# Patient Record
Sex: Male | Born: 1975 | Race: Asian | Hispanic: No | Marital: Married
Health system: Midwestern US, Community
[De-identification: ages and names within clinical notes are randomized; demographics above are authoritative.]

## PROBLEM LIST (undated history)

## (undated) DIAGNOSIS — R2232 Localized swelling, mass and lump, left upper limb: Secondary | ICD-10-CM

## (undated) HISTORY — PX: OTHER SURGICAL HISTORY: SHX169

---

## 1998-08-31 ENCOUNTER — Emergency Department (HOSPITAL_COMMUNITY): Admission: EM | Admit: 1998-08-31 | Discharge: 1998-08-31 | Payer: Self-pay | Admitting: Emergency Medicine

## 2016-01-16 DIAGNOSIS — D2362 Other benign neoplasm of skin of left upper limb, including shoulder: Secondary | ICD-10-CM | POA: Diagnosis not present

## 2016-03-09 DIAGNOSIS — J028 Acute pharyngitis due to other specified organisms: Secondary | ICD-10-CM | POA: Diagnosis not present

## 2016-03-09 DIAGNOSIS — R042 Hemoptysis: Secondary | ICD-10-CM | POA: Diagnosis not present

## 2016-03-11 DIAGNOSIS — J019 Acute sinusitis, unspecified: Secondary | ICD-10-CM | POA: Diagnosis not present

## 2016-03-11 DIAGNOSIS — R52 Pain, unspecified: Secondary | ICD-10-CM | POA: Diagnosis not present

## 2016-03-11 DIAGNOSIS — J181 Lobar pneumonia, unspecified organism: Secondary | ICD-10-CM | POA: Diagnosis not present

## 2016-03-11 DIAGNOSIS — Z6828 Body mass index (BMI) 28.0-28.9, adult: Secondary | ICD-10-CM | POA: Diagnosis not present

## 2016-10-21 ENCOUNTER — Other Ambulatory Visit: Payer: Self-pay | Admitting: Orthopedic Surgery

## 2016-11-02 ENCOUNTER — Encounter (HOSPITAL_BASED_OUTPATIENT_CLINIC_OR_DEPARTMENT_OTHER): Payer: Self-pay | Admitting: *Deleted

## 2016-11-07 ENCOUNTER — Ambulatory Visit: Payer: Self-pay | Admitting: Plastic Surgery

## 2016-11-07 DIAGNOSIS — L989 Disorder of the skin and subcutaneous tissue, unspecified: Secondary | ICD-10-CM

## 2016-11-09 ENCOUNTER — Encounter (HOSPITAL_BASED_OUTPATIENT_CLINIC_OR_DEPARTMENT_OTHER): Payer: Self-pay | Admitting: *Deleted

## 2016-11-09 ENCOUNTER — Encounter (HOSPITAL_BASED_OUTPATIENT_CLINIC_OR_DEPARTMENT_OTHER)
Admission: RE | Disposition: A | Discharge status: Home / Self Care | Payer: Self-pay | Source: Ambulatory Visit | Attending: Orthopedic Surgery

## 2016-11-09 ENCOUNTER — Ambulatory Visit (HOSPITAL_BASED_OUTPATIENT_CLINIC_OR_DEPARTMENT_OTHER)
Admission: RE | Admit: 2016-11-09 | Discharge: 2016-11-09 | Disposition: A | Discharge status: Home / Self Care | Payer: Self-pay | Source: Ambulatory Visit | Attending: Orthopedic Surgery | Admitting: Orthopedic Surgery

## 2016-11-09 DIAGNOSIS — L989 Disorder of the skin and subcutaneous tissue, unspecified: Secondary | ICD-10-CM

## 2016-11-09 DIAGNOSIS — D2362 Other benign neoplasm of skin of left upper limb, including shoulder: Secondary | ICD-10-CM | POA: Insufficient documentation

## 2016-11-09 DIAGNOSIS — M7989 Other specified soft tissue disorders: Secondary | ICD-10-CM | POA: Diagnosis not present

## 2016-11-09 DIAGNOSIS — R2232 Localized swelling, mass and lump, left upper limb: Secondary | ICD-10-CM | POA: Diagnosis not present

## 2016-11-09 DIAGNOSIS — M7138 Other bursal cyst, other site: Secondary | ICD-10-CM | POA: Insufficient documentation

## 2016-11-09 HISTORY — PX: MASS EXCISION: SHX2000

## 2016-11-09 HISTORY — DX: Localized swelling, mass and lump, left upper limb: R22.32

## 2016-11-09 SURGERY — MINOR EXCISION OF MASS
Anesthesia: LOCAL | Site: Hand | Laterality: Left

## 2016-11-09 MED ORDER — 0.9 % SODIUM CHLORIDE (POUR BTL) OPTIME
TOPICAL | Status: DC | PRN
  Administered 2016-11-09: 120 mL

## 2016-11-09 MED ORDER — CEFAZOLIN SODIUM-DEXTROSE 2-4 GM/100ML-% IV SOLN: 2.0000 g | INTRAVENOUS | Status: DC

## 2016-11-09 MED ORDER — TRAMADOL HCL 50 MG PO TABS: 50.0000 mg | ORAL_TABLET | Freq: Four times a day (QID) | ORAL | 0 refills | Status: AC | PRN

## 2016-11-09 MED ORDER — LIDOCAINE-EPINEPHRINE 1 %-1:100000 IJ SOLN
INTRAMUSCULAR | Status: AC
  Filled 2016-11-09: qty 1

## 2016-11-09 MED ORDER — LIDOCAINE-EPINEPHRINE 1 %-1:100000 IJ SOLN
INTRAMUSCULAR | Status: DC | PRN
  Administered 2016-11-09: 2.5 mL

## 2016-11-09 MED ORDER — CHLORHEXIDINE GLUCONATE 4 % EX LIQD: 60.0000 mL | Freq: Once | CUTANEOUS | Status: DC

## 2016-11-09 MED ORDER — LIDOCAINE HCL (PF) 1 % IJ SOLN
INTRAMUSCULAR | Status: AC
  Filled 2016-11-09: qty 30

## 2016-11-09 MED ORDER — BUPIVACAINE HCL (PF) 0.5 % IJ SOLN
INTRAMUSCULAR | Status: AC
  Filled 2016-11-09: qty 30

## 2016-11-09 MED ORDER — LIDOCAINE HCL (PF) 1 % IJ SOLN
INTRAMUSCULAR | Status: AC
  Filled 2016-11-09: qty 5

## 2016-11-09 MED ORDER — BUPIVACAINE-EPINEPHRINE 0.25% -1:200000 IJ SOLN
INTRAMUSCULAR | Status: AC
  Filled 2016-11-09: qty 1

## 2016-11-09 MED ORDER — BACITRACIN ZINC 500 UNIT/GM EX OINT
TOPICAL_OINTMENT | CUTANEOUS | Status: AC
  Filled 2016-11-09: qty 0.9

## 2016-11-09 MED ORDER — BUPIVACAINE HCL (PF) 0.5 % IJ SOLN
INTRAMUSCULAR | Status: DC | PRN
  Administered 2016-11-09: 2.5 mL

## 2016-11-09 SURGICAL SUPPLY — 85 items
BAG DECANTER FOR FLEXI CONT (MISCELLANEOUS) IMPLANT
BENZOIN TINCTURE PRP APPL 2/3 (GAUZE/BANDAGES/DRESSINGS) IMPLANT
BLADE CLIPPER SURG (BLADE) IMPLANT
BLADE MINI RND TIP GREEN BEAV (BLADE) IMPLANT
BLADE SURG 15 STRL LF DISP TIS (BLADE) ×4 IMPLANT
BLADE SURG 15 STRL SS (BLADE) ×2
BNDG COHESIVE 1X5 TAN STRL LF (GAUZE/BANDAGES/DRESSINGS) IMPLANT
BNDG COHESIVE 3X5 TAN STRL LF (GAUZE/BANDAGES/DRESSINGS) IMPLANT
BNDG CONFORM 2 STRL LF (GAUZE/BANDAGES/DRESSINGS) IMPLANT
BNDG ELASTIC 2X5.8 VLCR STR LF (GAUZE/BANDAGES/DRESSINGS) IMPLANT
BNDG ESMARK 4X9 LF (GAUZE/BANDAGES/DRESSINGS) ×3 IMPLANT
BNDG GAUZE ELAST 4 BULKY (GAUZE/BANDAGES/DRESSINGS) IMPLANT
CANISTER SUCT 1200ML W/VALVE (MISCELLANEOUS) IMPLANT
CHLORAPREP W/TINT 26ML (MISCELLANEOUS) ×3 IMPLANT
CLEANER CAUTERY TIP 5X5 PAD (MISCELLANEOUS) IMPLANT
CORD BIPOLAR FORCEPS 12FT (ELECTRODE) ×3 IMPLANT
COVER BACK TABLE 60X90IN (DRAPES) ×3 IMPLANT
COVER MAYO STAND STRL (DRAPES) ×6 IMPLANT
CUFF TOURNIQUET SINGLE 18IN (TOURNIQUET CUFF) ×3 IMPLANT
DERMABOND ADVANCED (GAUZE/BANDAGES/DRESSINGS) ×2
DERMABOND ADVANCED .7 DNX12 (GAUZE/BANDAGES/DRESSINGS) ×4 IMPLANT
DRAIN PENROSE 1/2X12 LTX STRL (WOUND CARE) IMPLANT
DRAIN PENROSE 1/4X12 LTX STRL (WOUND CARE) IMPLANT
DRAPE SURG 17X23 STRL (DRAPES) ×3 IMPLANT
DRAPE U-SHAPE 76X120 STRL (DRAPES) IMPLANT
ELECT COATED BLADE 2.86 ST (ELECTRODE) IMPLANT
ELECT NEEDLE BLADE 2-5/6 (NEEDLE) ×3 IMPLANT
ELECT REM PT RETURN 9FT ADLT (ELECTROSURGICAL)
ELECT REM PT RETURN 9FT PED (ELECTROSURGICAL)
ELECTRODE REM PT RETRN 9FT PED (ELECTROSURGICAL) IMPLANT
ELECTRODE REM PT RTRN 9FT ADLT (ELECTROSURGICAL) IMPLANT
GAUZE PACKING IODOFORM 1/4X15 (GAUZE/BANDAGES/DRESSINGS) IMPLANT
GAUZE SPONGE 4X4 12PLY STRL (GAUZE/BANDAGES/DRESSINGS) ×3 IMPLANT
GAUZE SPONGE 4X4 12PLY STRL LF (GAUZE/BANDAGES/DRESSINGS) IMPLANT
GAUZE XEROFORM 1X8 LF (GAUZE/BANDAGES/DRESSINGS) ×3 IMPLANT
GLOVE BIO SURGEON STRL SZ 6.5 (GLOVE) ×9 IMPLANT
GLOVE BIOGEL PI IND STRL 7.0 (GLOVE) ×2 IMPLANT
GLOVE BIOGEL PI IND STRL 8.5 (GLOVE) ×2 IMPLANT
GLOVE BIOGEL PI INDICATOR 7.0 (GLOVE) ×1
GLOVE BIOGEL PI INDICATOR 8.5 (GLOVE) ×1
GLOVE EXAM NITRILE MD LF STRL (GLOVE) ×3 IMPLANT
GLOVE SURG ORTHO 8.0 STRL STRW (GLOVE) ×3 IMPLANT
GOWN STRL REUS W/ TWL LRG LVL3 (GOWN DISPOSABLE) ×4 IMPLANT
GOWN STRL REUS W/TWL LRG LVL3 (GOWN DISPOSABLE) ×2
LOOP VESSEL MAXI BLUE (MISCELLANEOUS) IMPLANT
MARKER SKIN DUAL TIP RULER LAB (MISCELLANEOUS) ×3 IMPLANT
NEEDLE HYPO 30GX1 BEV (NEEDLE) ×3 IMPLANT
NEEDLE PRECISIONGLIDE 27X1.5 (NEEDLE) ×3 IMPLANT
NS IRRIG 1000ML POUR BTL (IV SOLUTION) ×3 IMPLANT
PACK BASIN DAY SURGERY FS (CUSTOM PROCEDURE TRAY) ×6 IMPLANT
PAD CAST 3X4 CTTN HI CHSV (CAST SUPPLIES) IMPLANT
PAD CLEANER CAUTERY TIP 5X5 (MISCELLANEOUS)
PADDING CAST ABS 4INX4YD NS (CAST SUPPLIES) ×1
PADDING CAST ABS COTTON 4X4 ST (CAST SUPPLIES) ×2 IMPLANT
PADDING CAST COTTON 3X4 STRL (CAST SUPPLIES)
PENCIL BUTTON HOLSTER BLD 10FT (ELECTRODE) IMPLANT
RUBBERBAND STERILE (MISCELLANEOUS) IMPLANT
SHEET MEDIUM DRAPE 40X70 STRL (DRAPES) IMPLANT
SPLINT PLASTER CAST XFAST 3X15 (CAST SUPPLIES) IMPLANT
SPLINT PLASTER XTRA FASTSET 3X (CAST SUPPLIES)
SPONGE GAUZE 2X2 8PLY STRL LF (GAUZE/BANDAGES/DRESSINGS) IMPLANT
STRIP CLOSURE SKIN 1/2X4 (GAUZE/BANDAGES/DRESSINGS) IMPLANT
SUCTION FRAZIER HANDLE 10FR (MISCELLANEOUS)
SUCTION TUBE FRAZIER 10FR DISP (MISCELLANEOUS) IMPLANT
SUT CHROMIC 4 0 P 3 18 (SUTURE) IMPLANT
SUT ETHILON 4 0 PS 2 18 (SUTURE) IMPLANT
SUT ETHILON 5 0 P 3 18 (SUTURE)
SUT MNCRL 6-0 UNDY P1 1X18 (SUTURE) ×2 IMPLANT
SUT MNCRL AB 4-0 PS2 18 (SUTURE) IMPLANT
SUT MON AB 5-0 P3 18 (SUTURE) IMPLANT
SUT MONOCRYL 6-0 P1 1X18 (SUTURE) ×1
SUT NYLON ETHILON 5-0 P-3 1X18 (SUTURE) IMPLANT
SUT PLAIN 5 0 P 3 18 (SUTURE) IMPLANT
SUT VIC AB 5-0 P-3 18X BRD (SUTURE) IMPLANT
SUT VIC AB 5-0 P3 18 (SUTURE)
SUT VICRYL 4-0 PS2 18IN ABS (SUTURE) IMPLANT
SWAB COLLECTION DEVICE MRSA (MISCELLANEOUS) IMPLANT
SWAB CULTURE ESWAB REG 1ML (MISCELLANEOUS) IMPLANT
SYR BULB 3OZ (MISCELLANEOUS) ×3 IMPLANT
SYR CONTROL 10ML LL (SYRINGE) ×6 IMPLANT
TOWEL OR 17X24 6PK STRL BLUE (TOWEL DISPOSABLE) ×6 IMPLANT
TRAY DSU PREP LF (CUSTOM PROCEDURE TRAY) IMPLANT
TUBE CONNECTING 20X1/4 (TUBING) IMPLANT
TUBE FEEDING ENTERAL 5FR 16IN (TUBING) IMPLANT
UNDERPAD 30X30 (UNDERPADS AND DIAPERS) ×3 IMPLANT

## 2016-11-09 NOTE — H&P (Signed)
  Nicholas Pacheco is an 41 y.o. male.   Chief Complaint: mass left forearm HPI: Nicholas Pacheco is a 41 year old right-hand-dominant male referred by Dr. Virgina Jock for consultation regarding a mass on his left forearm this on the dorsal ulnar aspect mid distal third of his left forearm which has been present and slightly enlarging for the past 6 weeks. He recalls no history of injury. He does play soccer and was wondering if he may have the hair area hit. He states he has a soreness to it if it gets hit with a VAS score of 1-2/10. He taken anything for this. No history diabetes thyroid problems arthritis or gout.He was sent for ultrasound . This been done and reveals a solid mass on the distal ulnar forearm.          Past Medical History:  Diagnosis Date  . Forearm mass, left     No past surgical history on file.  No family history on file. Social History:  has no tobacco, alcohol, and drug history on file.  Allergies: No Known Allergies  No prescriptions prior to admission.    No results found for this or any previous visit (from the past 48 hour(s)).  No results found.   Pertinent items are noted in HPI.  Height 5\' 7"  (1.702 m), weight 77.1 kg (170 lb).  General appearance: alert, cooperative and appears stated age Head: Normocephalic, without obvious abnormality Neck: no JVD Resp: clear to auscultation bilaterally Cardio: regular rate and rhythm, S1, S2 normal, no murmur, click, rub or gallop GI: soft, non-tender; bowel sounds normal; no masses,  no organomegaly Extremities: mass left forearm Pulses: 2+ and symmetric Skin: Skin color, texture, turgor normal. No rashes or lesions Neurologic: Grossly normal Incision/Wound: na  Assessment/Plan Assessment:  1. Mass of left forearm    Plan: We have discussed excision of this with him. Prepare postoperative course are discussed along with risks and complications. He is where there is no guarantee to the surgery possibility of  infection recurrence injury to arteries nerves tendons he is advised that the only way we can tell what this is is to send it off to pathology. Is in agreement with this will attempt to schedule it with the other surgeon taking the skin lesion off. Can be done as an outpatient under local.      Jiya Kissinger R 11/09/2016, 4:14 AM

## 2016-11-09 NOTE — Op Note (Signed)
Dictation Number [id number]

## 2016-11-09 NOTE — Op Note (Signed)
DATE OF OPERATION: 11/09/2016  LOCATION: [facility]  PREOPERATIVE DIAGNOSIS: changing lesion left hand  POSTOPERATIVE DIAGNOSIS: Same  PROCEDURE: Excision of left hand changing skin lesion 3 mm  SURGEON: Claire Sanger Dillingham, DO  EBL: 2 cc  CONDITION: Stable  COMPLICATIONS: None  INDICATION: The patient, Nicholas Pacheco, is a 41 y.o. male born on 13-Feb-1976, is here for treatment of a changing lesion of the left hand that has been getting larger over time.   PROCEDURE DETAILS:  The patient was seen prior to surgery and marked.  The IV antibiotics were given. The patient was taken to the operating room and given a anesthetic. A standard time out was performed and all information was confirmed by those in the room. SCDs were placed.  Local was injected at the site. The left upper extremity was prepped and draped in the usual sterile fashion.  The #15 blade was used to excise the 3 mm lesion in an elliptical fashion.  The lesion was sent to pathology.  The deep layers were closed with 6-0 Monocryl.  The skin was closed with a running subcuticular 6-0 Monocryl.  Derma bond was applied.  The patient was allowed to wake up and taken to recovery room in stable condition at the end of the case.

## 2016-11-09 NOTE — Op Note (Signed)
Nicholas Pacheco, Nicholas Pacheco  MEDICAL RECORD NO.:  [id number]  LOCATION:                                 FACILITY:  PHYSICIAN:  Daryll Brod, M.D.            DATE OF BIRTH:  DATE OF PROCEDURE:  11/09/2016 DATE OF DISCHARGE:                              OPERATIVE REPORT   PLACE OF SURGERY:  [facility].  PREOPERATIVE DIAGNOSIS:  Mass, left forearm.  POSTOPERATIVE DIAGNOSIS:  Mass, left forearm.  OPERATION:  Excisional biopsy of mass, left forearm.  SURGEON:  Daryll Brod, M.D.  ASSISTANT:  Audelia Hives, M.D.  ANESTHESIA:  Local.  HISTORY:  The patient is a 41 year old male with a history of a mass on his left distal forearm, this is on the dorsal ulnar aspect.  Ultrasound reveals this is solid.  He is desirous of having this removed.  Pre, peri, and postoperative course have been discussed along with risks and complications.  He is aware that there is no guarantee to the surgery; the possibility of infection; recurrence of injury to arteries, nerves, tendons; incomplete relief of symptoms; dystrophy.  In the preoperative area, the patient was seen, the extremity marked by both the patient and surgeon.  Antibiotic given.  DESCRIPTION OF PROCEDURE:  The patient was brought to the operating room.  Done in conjunction with Dr. Marla Roe for excision of a lesion on the skin of his left hand.  He was given a local infiltration anesthesia with 0.5% bupivacaine with epinephrine and 1% xylocaine without epinephrine, approximately 4 mL was used.  No tourniquet was used.  This was applied, but not inflated.  Injection was given after a time-out was taken, confirming the patient and procedure.  The limb was prepped with ChloraPrep prior to placement of drapes.  This was done in supine position.  Dr. Marla Roe performed her surgery first.  I assisted in retraction and cutting sutures.  First specimen was sent to Pathology.  A longitudinal  incision was then made over the mass on the forearm, carried down through subcutaneous tissue.  The mass was immediately identified with blunt and sharp dissection, this was dissected free, measured approximately 1 x 0.5 cm in diameter.  It was a solid in nature, white in appearance.  The specimen was sent to Pathology.  Wound was copiously irrigated with saline.  The subcutaneous tissue was closed with an interrupted 6-0 Monocryl suture and the skin with a subcuticular 6-0 Monocryl suture.  Dermabond was applied.  A sterile compressive dressing was applied to the forearm and hand.  The patient tolerated the procedure well and was taken to the recovery room for observation in satisfactory condition.  He will be discharged to home to return to [place] in 1 week on Ultram.          ______________________________ Daryll Brod, M.D.     GK/MEDQ  D:  11/09/2016  T:  11/09/2016  Job:  [id number]

## 2016-11-09 NOTE — Brief Op Note (Signed)
11/09/2016  9:31 AM  PATIENT:  Nicholas Pacheco  41 y.o. male  PRE-OPERATIVE DIAGNOSIS:  Changing Skin Lesion of Left Hand; Mass Left Forearm   R22.9   POST-OPERATIVE DIAGNOSIS:  Changing Skin Lesion of Left Hand; Mass Left Forearm     PROCEDURE:  Procedure(s): MINOR EXCISION OF CHANGING SKIN LESION OF LEFT HAND (Left) EXCISION MASS LEFT FOREARM (Left)  SURGEON:  Surgeon(s) and Role: Panel 1:    * Dillingham, Loel Lofty, DO - Primary  Panel 2:    * Daryll Brod, MD - Primary  PHYSICIAN ASSISTANT:   ASSISTANTS: C Dillingham, MD   ANESTHESIA:   local  EBL:  No intake/output data recorded.  BLOOD ADMINISTERED:none  DRAINS: none   LOCAL MEDICATIONS USED:  BUPIVICAINE  and XYLOCAINE   SPECIMEN:  Excision  DISPOSITION OF SPECIMEN:  PATHOLOGY  COUNTS:  YES  TOURNIQUET:    DICTATION: .Other Dictation: Dictation Number [id number]  PLAN OF CARE: Discharge to home after PACU  PATIENT DISPOSITION:  PACU - hemodynamically stable.

## 2016-11-09 NOTE — H&P (Addendum)
Nicholas Pacheco is an 41 y.o. male.   Chief Complaint: changing lesion left hand HPI: The patient is a 41 yrs old male here for treatment of a left hand changing skin lesion.  The area is on the dorsal aspect of the left hand.  It is firm and slightly movable.  It is not tender but seems to be getting larger.  He is otherwise in good health.  He has an additional lesion on the arm that will be excised by ortho.  Past Medical History:  Diagnosis Date  . Forearm mass, left     Past Surgical History:  Procedure Laterality Date  . OTHER SURGICAL HISTORY     small mass on middle of back done under local    History reviewed. No pertinent family history. Social History:  reports that he has never smoked. He has never used smokeless tobacco. He reports that he drinks about 8.4 oz of alcohol per week . His drug history is not on file.  Allergies: No Known Allergies  No prescriptions prior to admission.    No results found for this or any previous visit (from the past 48 hour(s)). No results found.  Review of Systems  Constitutional: Negative.   HENT: Negative.   Eyes: Negative.   Respiratory: Negative.   Cardiovascular: Negative.   Gastrointestinal: Negative.   Genitourinary: Negative.   Musculoskeletal: Negative.   Skin: Negative.   Neurological: Negative.   Psychiatric/Behavioral: Negative.     Blood pressure 119/87, pulse (!) 55, temperature 98.4 F (36.9 C), temperature source Oral, resp. rate 18, height 5\' 7"  (1.702 m), weight 77.1 kg (170 lb), SpO2 97 %. Physical Exam  Constitutional: He is oriented to person, place, and time. He appears well-developed and well-nourished.  HENT:  Head: Normocephalic and atraumatic.  Eyes: Pupils are equal, round, and reactive to light. Conjunctivae and EOM are normal.  Cardiovascular: Normal rate.   Respiratory: Effort normal. No respiratory distress.  GI: Soft. He exhibits no distension. There is no tenderness.  Musculoskeletal:   Arms: Neurological: He is alert and oriented to person, place, and time.  Skin: Skin is warm. No rash noted. No erythema. No pallor.  Psychiatric: He has a normal mood and affect. His behavior is normal. Judgment and thought content normal.     Assessment/Plan Plan excision of left hand lesion with primary closure and path evaluation.  Nicholas Going, DO 11/09/2016, 8:51 AM

## 2016-11-09 NOTE — Discharge Instructions (Signed)

## 2016-11-10 ENCOUNTER — Encounter (HOSPITAL_BASED_OUTPATIENT_CLINIC_OR_DEPARTMENT_OTHER): Payer: Self-pay | Admitting: Plastic Surgery

## 2016-12-13 DIAGNOSIS — Z0000 Encounter for general adult medical examination without abnormal findings: Secondary | ICD-10-CM | POA: Diagnosis not present

## 2016-12-16 DIAGNOSIS — Z0000 Encounter for general adult medical examination without abnormal findings: Secondary | ICD-10-CM | POA: Diagnosis not present

## 2016-12-16 DIAGNOSIS — Z23 Encounter for immunization: Secondary | ICD-10-CM | POA: Diagnosis not present

## 2016-12-16 DIAGNOSIS — Z1389 Encounter for screening for other disorder: Secondary | ICD-10-CM | POA: Diagnosis not present

## 2016-12-22 DIAGNOSIS — Z1212 Encounter for screening for malignant neoplasm of rectum: Secondary | ICD-10-CM | POA: Diagnosis not present

## 2017-02-04 DIAGNOSIS — Z302 Encounter for sterilization: Secondary | ICD-10-CM | POA: Diagnosis not present

## 2017-05-04 DIAGNOSIS — N451 Epididymitis: Secondary | ICD-10-CM | POA: Diagnosis not present

## 2017-05-06 ENCOUNTER — Other Ambulatory Visit: Payer: Self-pay | Admitting: Urology

## 2017-05-06 DIAGNOSIS — N451 Epididymitis: Secondary | ICD-10-CM

## 2017-05-09 ENCOUNTER — Ambulatory Visit (HOSPITAL_COMMUNITY)
Admission: RE | Admit: 2017-05-09 | Discharge: 2017-05-09 | Disposition: A | Discharge status: Home / Self Care | Payer: BLUE CROSS/BLUE SHIELD | Source: Ambulatory Visit | Attending: Urology | Admitting: Urology

## 2017-05-09 DIAGNOSIS — N451 Epididymitis: Secondary | ICD-10-CM | POA: Insufficient documentation

## 2017-05-09 DIAGNOSIS — N433 Hydrocele, unspecified: Secondary | ICD-10-CM | POA: Diagnosis not present

## 2017-05-09 DIAGNOSIS — N5089 Other specified disorders of the male genital organs: Secondary | ICD-10-CM | POA: Insufficient documentation

## 2017-06-10 DIAGNOSIS — N451 Epididymitis: Secondary | ICD-10-CM | POA: Diagnosis not present

## 2017-08-02 DIAGNOSIS — M7711 Lateral epicondylitis, right elbow: Secondary | ICD-10-CM | POA: Diagnosis not present

## 2017-08-29 DIAGNOSIS — M1711 Unilateral primary osteoarthritis, right knee: Secondary | ICD-10-CM | POA: Diagnosis not present

## 2017-08-29 DIAGNOSIS — M7711 Lateral epicondylitis, right elbow: Secondary | ICD-10-CM | POA: Diagnosis not present

## 2017-09-27 DIAGNOSIS — M7711 Lateral epicondylitis, right elbow: Secondary | ICD-10-CM | POA: Diagnosis not present

## 2017-11-28 DIAGNOSIS — S39012A Strain of muscle, fascia and tendon of lower back, initial encounter: Secondary | ICD-10-CM | POA: Diagnosis not present

## 2017-11-28 DIAGNOSIS — M545 Low back pain: Secondary | ICD-10-CM | POA: Diagnosis not present

## 2017-11-29 DIAGNOSIS — M7711 Lateral epicondylitis, right elbow: Secondary | ICD-10-CM | POA: Diagnosis not present

## 2017-12-16 DIAGNOSIS — Z0000 Encounter for general adult medical examination without abnormal findings: Secondary | ICD-10-CM | POA: Diagnosis not present

## 2017-12-16 DIAGNOSIS — Z125 Encounter for screening for malignant neoplasm of prostate: Secondary | ICD-10-CM | POA: Diagnosis not present

## 2017-12-30 DIAGNOSIS — M545 Low back pain: Secondary | ICD-10-CM | POA: Diagnosis not present

## 2017-12-30 DIAGNOSIS — M7711 Lateral epicondylitis, right elbow: Secondary | ICD-10-CM | POA: Diagnosis not present

## 2017-12-30 DIAGNOSIS — Z6826 Body mass index (BMI) 26.0-26.9, adult: Secondary | ICD-10-CM | POA: Diagnosis not present

## 2017-12-30 DIAGNOSIS — E7849 Other hyperlipidemia: Secondary | ICD-10-CM | POA: Diagnosis not present

## 2017-12-30 DIAGNOSIS — Z1389 Encounter for screening for other disorder: Secondary | ICD-10-CM | POA: Diagnosis not present

## 2017-12-30 DIAGNOSIS — Z0000 Encounter for general adult medical examination without abnormal findings: Secondary | ICD-10-CM | POA: Diagnosis not present

## 2018-01-11 DIAGNOSIS — M7711 Lateral epicondylitis, right elbow: Secondary | ICD-10-CM | POA: Diagnosis not present

## 2018-05-16 DIAGNOSIS — Z6823 Body mass index (BMI) 23.0-23.9, adult: Secondary | ICD-10-CM | POA: Diagnosis not present

## 2018-05-16 DIAGNOSIS — J111 Influenza due to unidentified influenza virus with other respiratory manifestations: Secondary | ICD-10-CM | POA: Diagnosis not present

## 2018-05-16 DIAGNOSIS — H6692 Otitis media, unspecified, left ear: Secondary | ICD-10-CM | POA: Diagnosis not present

## 2018-06-20 DIAGNOSIS — M7711 Lateral epicondylitis, right elbow: Secondary | ICD-10-CM | POA: Diagnosis not present

## 2018-07-04 DIAGNOSIS — H6692 Otitis media, unspecified, left ear: Secondary | ICD-10-CM | POA: Diagnosis not present

## 2018-07-04 DIAGNOSIS — R509 Fever, unspecified: Secondary | ICD-10-CM | POA: Diagnosis not present

## 2018-08-24 DIAGNOSIS — M7711 Lateral epicondylitis, right elbow: Secondary | ICD-10-CM | POA: Diagnosis not present

## 2018-09-17 IMAGING — US US SCROTUM W/ DOPPLER COMPLETE
1 series · 14 of 25 positions shown · non-contrast
Comparison: None.

CLINICAL DATA: Epididymitis.

EXAM:
SCROTAL ULTRASOUND
DOPPLER ULTRASOUND OF THE TESTICLES
TECHNIQUE: Complete ultrasound examination of the testicles, epididymis, and
other scrotal structures was performed. Color and spectral Doppler
ultrasound were also utilized to evaluate blood flow to the
testicles.

[Series 1: us scrotum w/ doppler complete · 0.06mm/px · 14 of 57 slices shown]
[im 1/57]
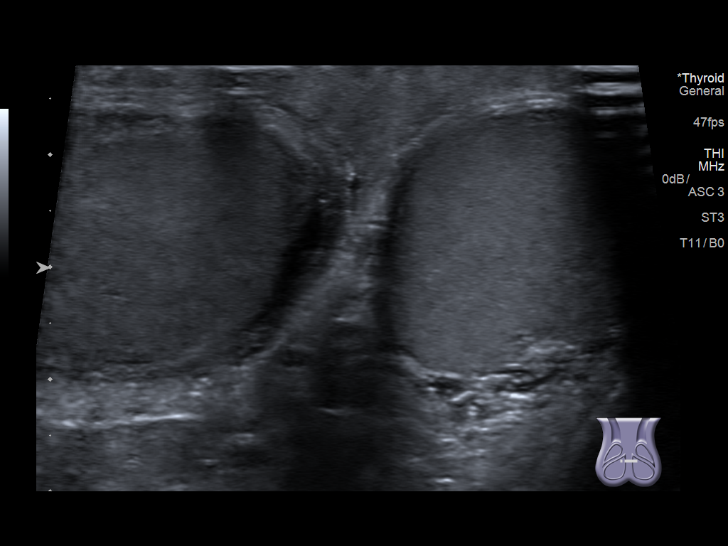
[im 5/57]
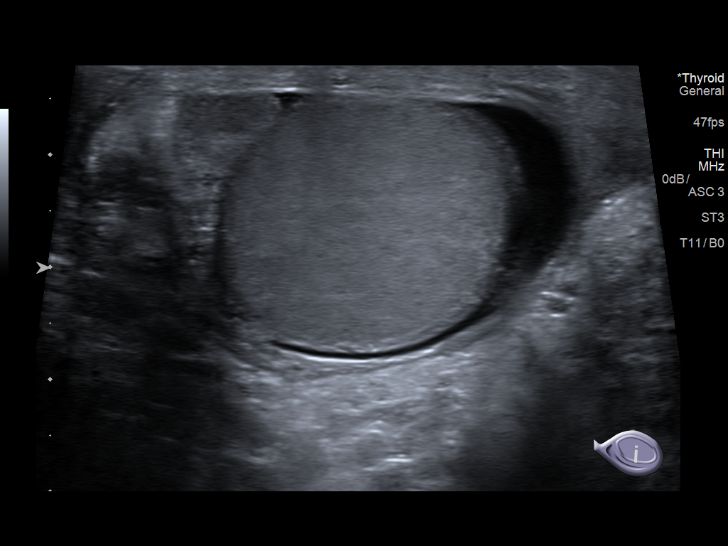
[im 10/57]
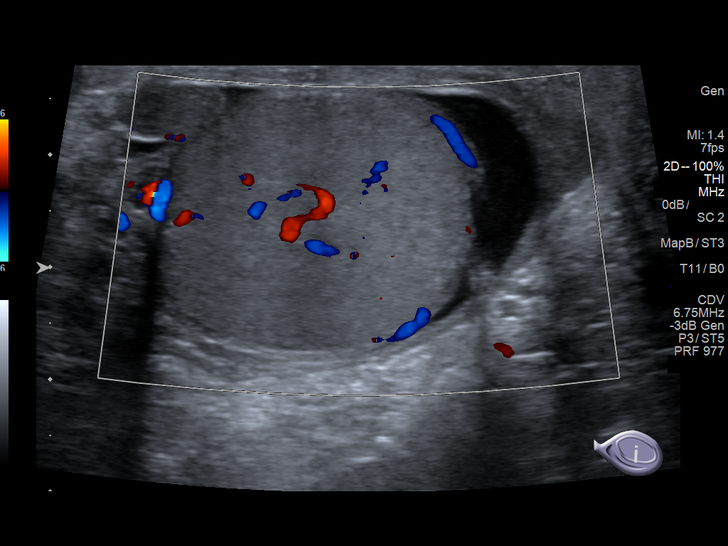
[im 15/57]
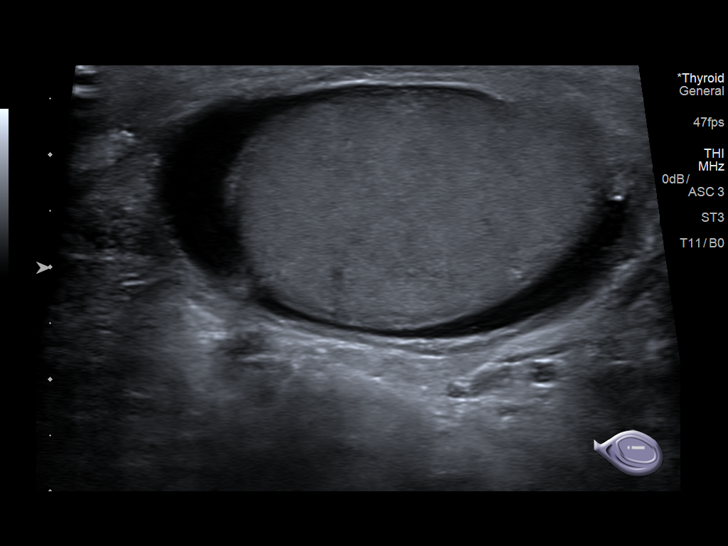
[im 19/57]
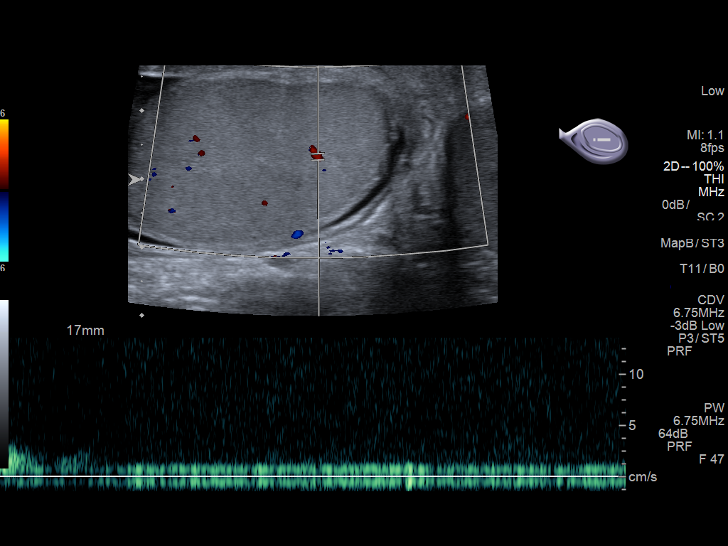
[im 22/57]
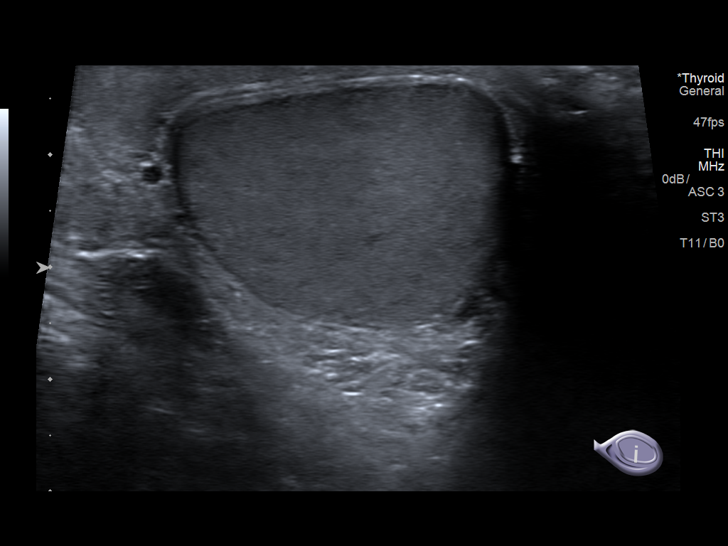
[im 26/57]
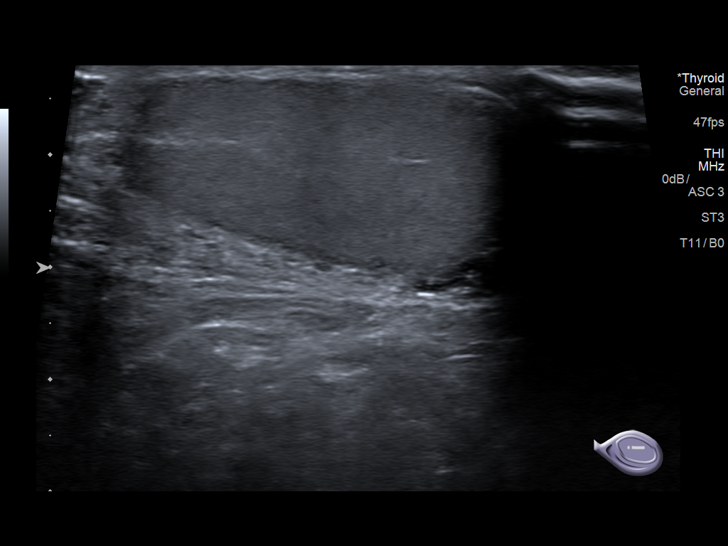
[im 31/57]
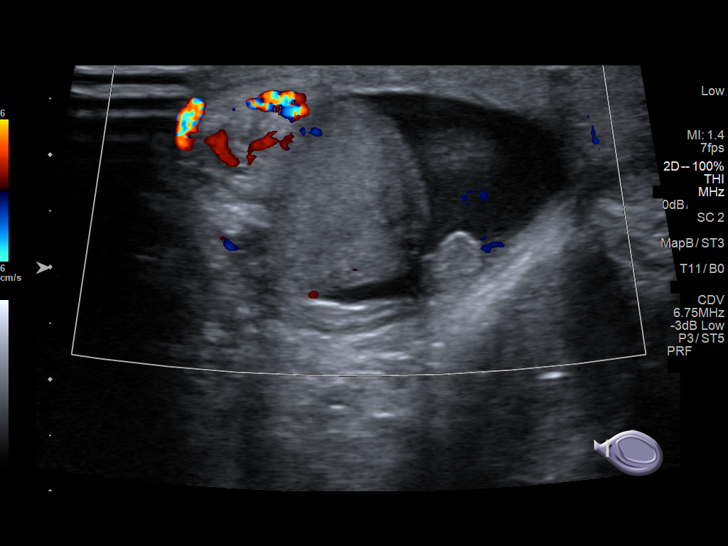
[im 36/57]
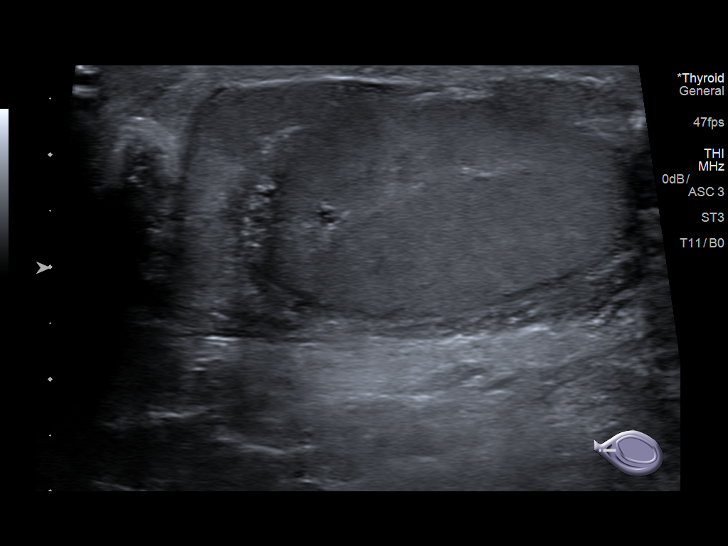
[im 38/57]
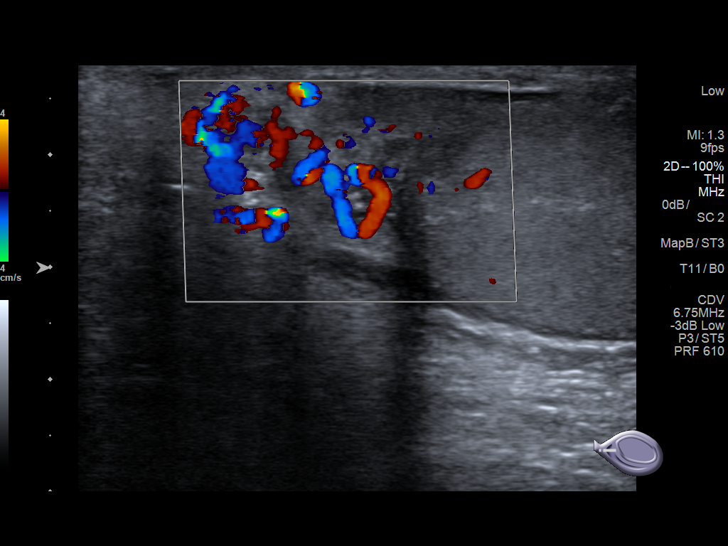
[im 43/57]
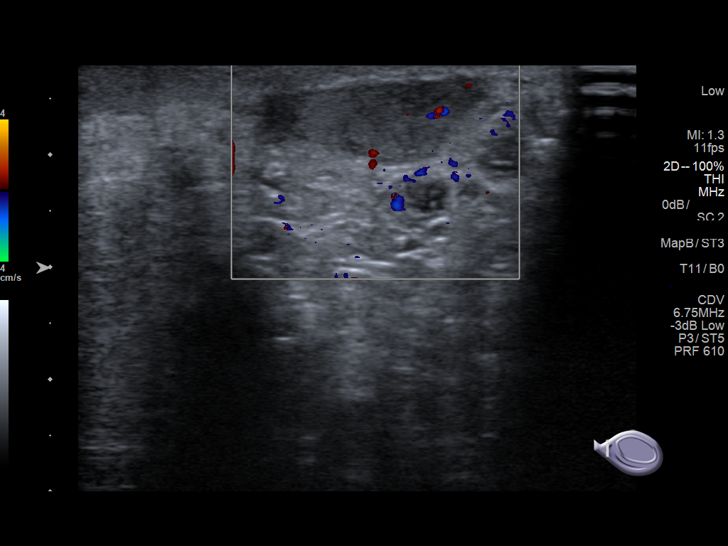
[im 47/57]
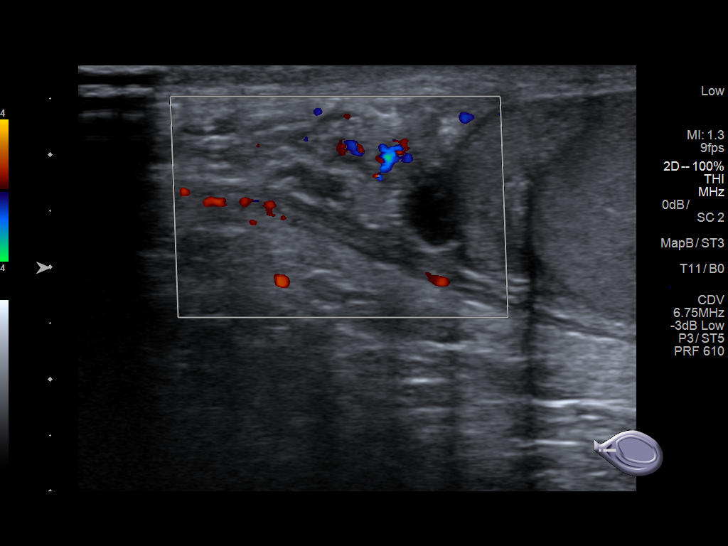
[im 52/57]
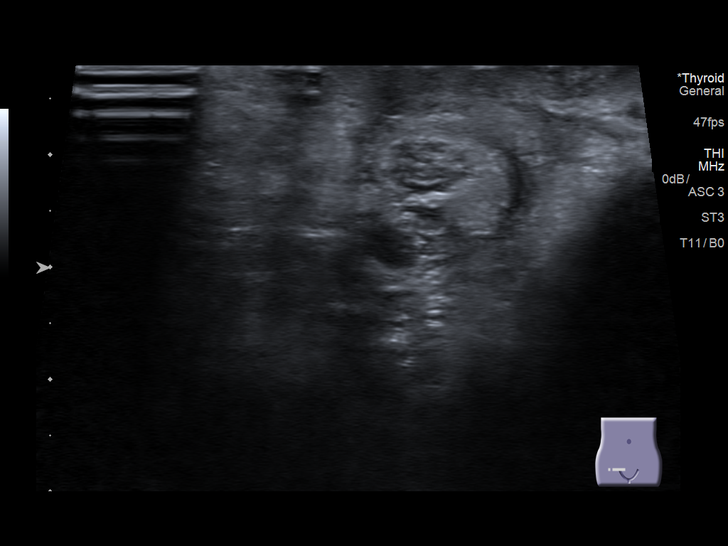
[im 57/57]
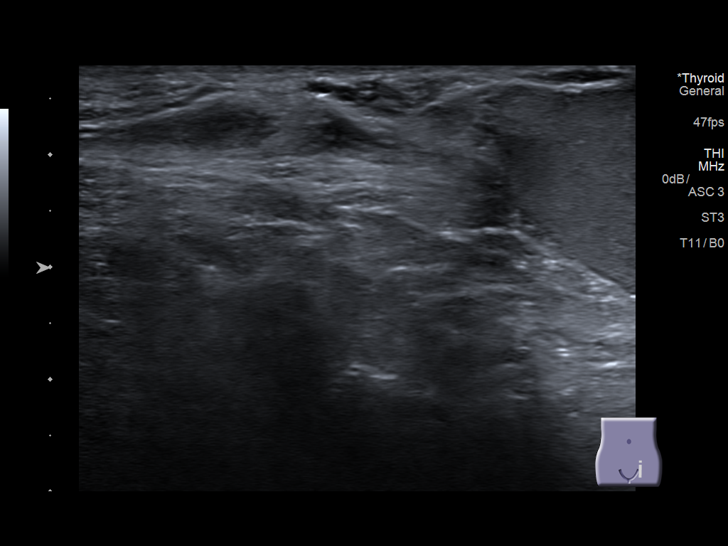

[14 of 25 positions shown; findings below may reference images not displayed]

FINDINGS: Right testicle

Measurements: 4.0 x 2.4 x 2.9 centimeters. No mass or microlithiasis
visualized.

Left testicle

Measurements: 4.0 x 2.2 x 2.9 centimeters. No mass or microlithiasis
visualized.

Right epididymis: Right epididymis is more prominent compared to
left. Doppler evaluation shows increased vascularity of the right
epididymis.

Left epididymis:  Normal in size and appearance.

Hydrocele:  Trace right hydrocele.

Varicocele:  None visualized.

Pulsed Doppler interrogation of both testes demonstrates normal low
resistance arterial and venous waveforms bilaterally.
IMPRESSION: 1. No evidence for testicular torsion or mass.
2. Prominent, enlarged right epididymis, consistent with
epididymitis.

## 2018-11-02 DIAGNOSIS — M7711 Lateral epicondylitis, right elbow: Secondary | ICD-10-CM | POA: Diagnosis not present

## 2019-01-01 DIAGNOSIS — E7849 Other hyperlipidemia: Secondary | ICD-10-CM | POA: Diagnosis not present

## 2019-01-01 DIAGNOSIS — Z125 Encounter for screening for malignant neoplasm of prostate: Secondary | ICD-10-CM | POA: Diagnosis not present

## 2019-01-01 DIAGNOSIS — R7989 Other specified abnormal findings of blood chemistry: Secondary | ICD-10-CM | POA: Diagnosis not present

## 2019-01-01 DIAGNOSIS — Z0000 Encounter for general adult medical examination without abnormal findings: Secondary | ICD-10-CM | POA: Diagnosis not present

## 2019-01-08 DIAGNOSIS — M7711 Lateral epicondylitis, right elbow: Secondary | ICD-10-CM | POA: Diagnosis not present

## 2019-01-08 DIAGNOSIS — E785 Hyperlipidemia, unspecified: Secondary | ICD-10-CM | POA: Diagnosis not present

## 2019-01-08 DIAGNOSIS — Z1331 Encounter for screening for depression: Secondary | ICD-10-CM | POA: Diagnosis not present

## 2019-01-08 DIAGNOSIS — R7989 Other specified abnormal findings of blood chemistry: Secondary | ICD-10-CM | POA: Diagnosis not present

## 2019-01-08 DIAGNOSIS — Z0000 Encounter for general adult medical examination without abnormal findings: Secondary | ICD-10-CM | POA: Diagnosis not present

## 2019-01-08 DIAGNOSIS — R209 Unspecified disturbances of skin sensation: Secondary | ICD-10-CM | POA: Diagnosis not present

## 2019-01-11 DIAGNOSIS — M7711 Lateral epicondylitis, right elbow: Secondary | ICD-10-CM | POA: Diagnosis not present

## 2020-11-19 NOTE — Telephone Encounter (Signed)
 Received call from Monona at [facility] with [employer] Complaint.    Subjective: Caller states I don't have the pain all the time. It definitely gets flared up when I climb. When I exert a lot of shoulder pressure to it, it flares up. I can't even function. If I raise it to a certain point it's an immediate sharp pain like it's going to snap. 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 Patient is currently unestablished. Writer advised patient to be seen at [facility] if unable to get new patient appointment within the next four hours. Patient agreeable.    Care advice provided, patient verbalizes understanding; denies any other questions or concerns; instructed to call back for any new or worsening symptoms.    Patient/Caller agrees with recommended disposition; Clinical research associate provided warm transfer to Allie at [facility] for appointment scheduling.    Attention Provider:  Thank you for allowing me to participate in the care of your patient.  The patient was connected to triage in response to information provided to the [facility].  Please do not respond through this encounter as the response is not directed to a shared pool.      Reason for Disposition   SEVERE pain (e.g., excruciating, unable to do any normal activities)    Protocols used: Shoulder Pain-ADULT-[place]

## 2020-11-20 ENCOUNTER — Ambulatory Visit: Attending: Family Medicine | Primary: Family Medicine

## 2020-11-20 ENCOUNTER — Ambulatory Visit
Admit: 2020-11-20 | Discharge: 2020-11-20 | Payer: BLUE CROSS/BLUE SHIELD | Attending: Family Medicine | Primary: Family Medicine

## 2020-11-20 ENCOUNTER — Encounter: Admit: 2020-11-20 | Payer: BLUE CROSS/BLUE SHIELD | Primary: Family Medicine

## 2020-11-20 DIAGNOSIS — M25512 Pain in left shoulder: Secondary | ICD-10-CM

## 2020-11-20 DIAGNOSIS — M7542 Impingement syndrome of left shoulder: Secondary | ICD-10-CM

## 2020-11-20 MED ORDER — MELOXICAM 15 MG TAB
15 mg | ORAL_TABLET | Freq: Every day | ORAL | 2 refills | Status: AC
Start: 2020-11-20 — End: ?

## 2020-11-20 NOTE — Progress Notes (Signed)
Chief Complaint   Patient presents with    Shoulder Pain     Patient is here for left shoulder pain      he is a 44 y.o. year old male who presents for evaluation of left shoulder  Pain Assessment Encounter      Taylor Duncan  11/20/2020  Onset of Symptoms: Patient states he has had pain for years   ________________________________________________________________________  Description:Patient states he thinks pain is from fly fishing. Patient also states he does rock climbing     Frequency: 4-5 times a day  Pain Scale:(1-10): 2  Trauma Hx: fly fishing  Hx of similar symptoms: No:   Radiation: NO, shoulder   Duration:  intermittent      Progression: has worsened  What makes it better?: heat, ice, and OTC meds  What makes it worse?:exercise and strecthing  Medications tried: acetaminophen, ibuprofen    Reviewed and agree with Nurse Note and duplicated in this note.  Reviewed PmHx, RxHx, FmHx, SocHx, AllgHx and updated and dated in the chart.    No family history on file.  No past medical history on file.   Social History     Socioeconomic History    Marital status: MARRIED        Review of Systems - negative except as listed above      Objective:     Vitals:    11/20/20 1028   BP: 117/78   Pulse: (!) 53   Resp: 16   Temp: 98 ??F (36.7 ??C)   SpO2: 97%   Weight: 158 lb (71.7 kg)       Physical Examination: General appearance - alert, well appearing, and in no distress  Back exam - full range of motion, no tenderness, palpable spasm or pain on motion  Neurological - alert, oriented, normal speech, no focal findings or movement disorder noted  Musculoskeletal - The left shoulder is  normal to inspection.    The patient has full range of motion  .    The shoulderis not tender to palpation .  Bicep tendon: non-tender  The NEER test is positive.  The Hawkins test:is positive   The Cross over test:is  negative.  The Empty Can test:is  negative.  Stressed ext rotation is:  negative.  Stressed int rotation: negative.   The  Apprehension Sign is negative.    The Lift off test is:  negative   Examination reveals the Painful Arch:  negative.   The Labral Test is:  not tested.   The Sulcus Sign is:  not tested.  Extremities - peripheral pulses normal, no pedal edema, no clubbing or cyanosis  Skin - normal coloration and turgor, no rashes, no suspicious skin lesions noted   Indications for study: left shoulder pain    A limited  musculoskeletal ultrasound examination was performed on the left shoulder with the following findings: Biceps (LHB) tendon - long:  normal echogenic, linearly compact fibrillar appearance   Biceps (LHB) tendon - trans:  normal echogenic, tessellate compact fibrillar appearance   Subacromial (SA) impingement testing: Decreased maintenance of SA space and restricted supraspinatus glide  Supraspinatus tendon - long: normal echogenic with fraying noted on bursal side, linearly compact fibrillar birds-beak appearance   Infraspinatus tendon - long: normal echogenic, linearly compact fibrillar birds-beak appearance   Teres minor tendon - long: normal echogenic, linearly compact fibrillar birds-beak appearance   Supraspinatus/Infraspinatus/Teres minor tendons - trans: normal echogenic, tessellate compact fibrillar wagon-wheeled appearance  Impression: Left shoulder impingement with bursal sided partial fraying    Scanned and Interpreted by Clinton Sawyer, MD CAQSM RMSK        Assessment/ Plan:   Diagnoses and all orders for this visit:    1. Impingement syndrome of left shoulder  -     REFERRAL TO PHYSICAL THERAPY    2. Acute pain of left shoulder  -     XR SHOULDER LT AP/LAT MIN 2 V; Future  -     AMB POC US,EXTREMITY,NONVASCULAR,REAL-TIME IMAGE,LIMITED    3. Impingement syndrome of right shoulder  -     REFERRAL TO PHYSICAL THERAPY       Pathophysiology, recovery and rehabilitation process discussed and questions answered   Counseling for 30 Minutes of the total visit duration   Pictures and figures used as necessary    Provided reassurance   Shoulder Tubing Exercises reviewed and Theraband provided  Discussed steroid side effects of fat atrophy, hypopigmentation, steroid flare or infection   Monitor response to Physical Therapy              1) Remember to stay active and/or exercise regularly (I suggest 30-45 minutes daily)   2) For reliable dietary information, go to www.[employer].fi. You may wish to consider seeing the nutritionist at [facility] [phone], also consider the Mediterranean diet.      I have discussed the diagnosis with the patient and the intended plan as seen in the above orders.  The patient has received an after-visit summary and questions were answered concerning future plans.     Medication Side Effects and Warnings were discussed with patient,  Patient Labs were reviewed and or requested, and  Patient Past Records were reviewed and or requested  yes      Pt agrees to call or return to clinic and/or go to closest ER with any worsening of symptoms.  This may include, but not limited to increased fever (>100.4) with NSAIDS or Tylenol, increased edema, confusion, rash, worsening of presenting symptoms.    Please note that this dictation was completed with Dragon, the computer voice recognition software.  Quite often unanticipated grammatical, syntax, homophones, and other interpretive errors are inadvertently transcribed by the computer software.  Please disregard these errors.  Please excuse any errors that have escaped final proofreading.  Thank you.

## 2020-11-20 NOTE — Addendum Note (Signed)
Addendum  Note by Cassell Clement, MD at 11/20/20 0930                Author: Cassell Clement, MD  Service: --  Author Type: Physician       Filed: 11/20/20 1108  Encounter Date: 11/20/2020  Status: Signed          Editor: Cassell Clement, MD (Physician)          Addended by: Cassell Clement on: 11/20/2020 11:08 AM    Modules accepted: Orders

## 2020-12-02 ENCOUNTER — Inpatient Hospital Stay
Admit: 2020-12-02 | Payer: BLUE CROSS/BLUE SHIELD | Attending: Rehabilitative and Restorative Service Providers" | Primary: Family Medicine

## 2020-12-02 NOTE — Progress Notes (Signed)
PT INITIAL EVALUATION NOTE - MCR 2-15    Patient Name: Taylor Duncan  Date:12/02/2020  DOB: 17-Mar-1976  [x]   Patient DOB Verified  Payor: BLUE CROSS MEDICARE / Plan: [employer] HMO / Product Type: Managed Care Medicare /    In time: 835a  Out time: 935a  Total Treatment Time (min): 60  Total Timed Codes (min): 12  1:1 Treatment Time (MC only): 12   Visit #: 1     Treatment Area: Impingement syndrome of right shoulder [M75.41]  Impingement syndrome of left shoulder [M75.42]    SUBJECTIVE  Pain Level (0-10 scale): 0/10 at best and 10/10 at worst  Any medication changes, allergies to medications, adverse drug reactions, diagnosis change, or new procedure performed?: []  No    [x]  Yes (see summary sheet for update)  Subjective:    Approx 2 months ago onset of left and then right shoulder pain after a day of rock climbing.  Usually the shoulder pain goes away after a few days of rest but since 2 months ago it is not going away. He does have shoulder pain with fly fishing and reaching overhead with any load as well.          OBJECTIVE  Posture:  forward head protracted scapula in sitting  Other Observations:  --  Functional Movement:    Functional Movement Screen Right Left   Deep Squat 2 2   Hurdle Step 3 3   Inline Lunge -- --   Shoulder Mobility 1 1   Active Straight-Leg Raise -- --   Trunk Stability Push-Up -- --   Rotary Stability -- --       Palpation: tenderness at anterolateral subacromial region Left.. No pain on right.    Left Shoulder ROM:  AROM   PROM   Flexion   150 P!   155 P!   Extension  --   --   Abduction  110 P!   120 P!   Adduction  60   65   IR   Hand to L2  80   ER   Hand to C7  90    Right Shoulder ROM:  AROM   PROM   Flexion   155 P!   165 P!   Extension  --   --   Abduction  110 P!   120 P!   Adduction  60   65   IR   Hand to L2  80   ER   Hand to C7  90    Joint Mobility Assessment: Glenohumeral: left A-P hypomobility      Acromioclavicular: normal      Sternoclavicular:  normal    Flexibility: shoulder active mobility reduced bilaterally    UPPER QUARTER   MUSCLE STRENGTH  KEY       R  L  0 - No Contraction   Flexion  5  5 P!  1 - Trace    Extension 5  5  2  - Poor    Abduction 5  5 P!  3 - Fair     IR  5  5  4  - Good    ER  5  5  5  - Normal       Neurological: Reflexes / Sensations: normal  Special Tests: Neer Impingement: positive Left  Hawkins-Kennedy: negative bilateral      Scapular Reposition: positive on left  Shoulder Abduction: negative bilateral  10 min Therapeutic Exercise:  [x]  See flow sheet : demonstration and preparation of HEP   Rationale: increase ROM, increase strength, improve coordination, improve balance, and increase proprioception to improve the patient's ability to reach overhead and behind back without pain            With   []  TE   []  TA   []  Neuro   []  SC   []  other: Patient Education: [x]  Review HEP    []  Progressed/Changed HEP based on:   []  positioning   []  body mechanics   []  transfers   []  heat/ice application    []  other:      Other Objective/Functional Measures: FOTO Functional Measure: 63/100                         Pain Level (0-10 scale) post treatment: 0/10    ASSESSMENT/Changes in Function:     [x]   See Plan of Care      Trinda Pascal, PT, DPT 12/02/2020

## 2020-12-02 NOTE — Other (Addendum)
Therapy Evaluation by Trinda Pascal, PT, DPT at 12/02/20 0830                Author: Trinda Pascal, PT, DPT  Service: FAMILY MEDICINE  Author Type: Physical Therapist       Filed: 01/08/21 0539  Date of Service: 12/02/20 0830  Status: Addendum          Editor: Trinda Pascal, PT, DPT (Physical Therapist)          Related Notes: Original Note by Trinda Pascal, PT, DPT (Physical Therapist) filed at 12/08/20  1418               Physical Therapy at [facility],    a part of [facility]   [address]   [place], [place] [address]   Phone: [phone]  Fax: [phone]      Plan of Care/Statement of Necessity for Physical Therapy Services  2-15      Patient name: Taylor Duncan  DOB : [birth year]  Provider#: [id number]   Referral source: Cassell Clement, MD       Medical/Treatment Diagnosis: Impingement syndrome of right shoulder [M75.41]   Impingement syndrome of left shoulder [M75.42]      Prior Hospitalization: see medical history      Comorbidities: none noted   Prior Level of Function: complete 20 minutes of exercise at least 3 times a week; rock climbing  and fly fishing.   Medications: Verified on Patient Summary List      Start of Care: 12/02/2020      Onset Date: 09/2020         The Plan of Care and following information is based on the information from the initial evaluation.   Assessment/ key information: 45 y.o. presenting with Bilateral shoulder impingement with Left > Right.  Shoulder mobility is primary underlying  issue resulting in over stress to rotator cuff with overhead activity.  Will focus on passive and active mobility followed by stability training.      Evaluation Complexity History LOW Complexity : Zero comorbidities / personal factors  that will impact the outcome / POC; Examination HIGH Complexity : 4+ Standardized tests and measures addressing body structure, function, activity  limitation and / or participation in  recreation  ;Presentation LOW Complexity : Stable, uncomplicated  ; Clinical Decision Making MEDIUM Complexity : FOTO score of 26-74   Overall Complexity Rating: LOW       Problem List: pain affecting function, decrease ROM, decrease strength, decrease ADL/ functional abilitiies, decrease activity tolerance, and decrease  flexibility/ joint mobility    Treatment Plan may include any combination of the following: Therapeutic exercise, Therapeutic activities, Neuromuscular re-education, Physical  agent/modality, Gait/balance training, Manual therapy, Patient education, and Self Care training   Patient / Family readiness to learn indicated by: asking questions and trying to perform skills   Persons(s) to be included in education: patient (P)   Barriers to Learning/Limitations: None   Patient Goal (s): get rid of pain   Patient Self Reported Health Status: excellent   Rehabilitation Potential: good      Short/Long Term Goals: To be accomplished in 3-6 treatments:   1) Pt will be able to Reach above shoulder level without increase of pain   2) Pt will be able to wash back without increase of pain   3) Pt will be independent with HEP      Frequency / Duration: Patient  to be seen 1 times per week for 3-6 treatments.      Patient/ Caregiver education and instruction: activity modification and exercises      [x]   Plan of care has been reviewed with PTA      , PT, DPT 12/02/2020       ________________________________________________________________________      I certify that the above Therapy Services are being furnished while the patient is under my care. I agree with the treatment plan and certify that this therapy is necessary.      Physician's Signature:____________________  Date:____________Time: _________       12/04/2020, MD

## 2020-12-08 ENCOUNTER — Encounter: Payer: BLUE CROSS/BLUE SHIELD | Attending: Rehabilitative and Restorative Service Providers" | Primary: Family Medicine

## 2020-12-08 NOTE — Progress Notes (Signed)
 PT DAILY TREATMENT NOTE - MCR 2-15    Patient Name: Taylor Duncan  Date:12/08/2020  DOB: October 10, 1975  [x]   Patient DOB Verified  Payor: BLUE CROSS / Plan: [place]  PPO / Product Type: PPO /    In time: 1248p  Out time: 148p  Total Treatment Time (min): 60  Total Timed Codes (min): 60  1:1 Treatment Time (MC only): 56   Visit #:  2    Treatment Area: Impingement syndrome of right shoulder [M75.41]  Impingement syndrome of left shoulder [M75.42]    SUBJECTIVE  Pain Level (0-10 scale): 0/10  Any medication changes, allergies to medications, adverse drug reactions, diagnosis change, or new procedure performed?: [x]  No    []  Yes (see summary sheet for update)  Subjective functional status/changes:   []  No changes reported  Pt indicates that he did not do any of his HEP since his evaluation as he has been traveling out of town.  He has not done any rock climbing either, so his shoulder is not hurting today.    OBJECTIVE    50 min Therapeutic Exercise:  [x]  See flow sheet :   Rationale: increase ROM, increase strength, improve coordination, improve balance, and increase proprioception to improve the patient's ability to perform overhead activity without pain.    10 min Manual Therapy: inferior G-H mobs grade 3-4, A-P G-H stabilization with passive shoulder flex and abd    Rationale: decrease pain, increase ROM, and increase tissue extensibility to improve the patient's ability to perform reaching tasks overhead            With   []  TE   []  TA   []  Neuro   []  SC   []  other: Patient Education: [x]  Review HEP    []  Progressed/Changed HEP based on:   []  positioning   []  body mechanics   []  transfers   []  heat/ice application    []  other:      Other Objective/Functional Measures: Left Shoulder PROM Flex 167 P!     Pain Level (0-10 scale) post treatment: 0/10    ASSESSMENT/Changes in Function:   Advanced with passive and active mobility training.  Added shoulder stabilization training at end of session.  Quite limited  with shoulder sweep active mobility exercise not able to maintain contact with ground throughout the movement.  Patient will continue to benefit from skilled PT services to modify and progress therapeutic interventions, address functional mobility deficits, address ROM deficits, address strength deficits, analyze and address soft tissue restrictions, analyze and cue movement patterns, analyze and modify body mechanics/ergonomics, and assess and modify postural abnormalities to attain remaining goals.     []   See Plan of Care  []   See progress note/recertification  []   See Discharge Summary         Progress towards goals / Updated goals:  Short/Long Term Goals: To be accomplished in 3-6 treatments:  1) Pt will be able to Reach above shoulder level without increase of pain  2) Pt will be able to wash back without increase of pain  3) Pt will be independent with HEP       PLAN  [x]   Upgrade activities as tolerated     [x]   Continue plan of care  [x]   Update interventions per flow sheet       []   Discharge due to:_  []   Other:_      Ozell JONETTA Och, PT, DPT 12/08/2020

## 2020-12-15 ENCOUNTER — Inpatient Hospital Stay
Admit: 2020-12-15 | Payer: BLUE CROSS/BLUE SHIELD | Attending: Rehabilitative and Restorative Service Providers" | Primary: Family Medicine

## 2020-12-15 NOTE — Progress Notes (Signed)
 PT DAILY TREATMENT NOTE - MCR 2-15    Patient Name: Taylor Duncan  Date:12/15/2020  DOB: [birth year]  [x]   Patient DOB Verified  Payor: BLUE CROSS / [place]: [place]  PPO / Product Type: PPO /    In time: 313p  Out time: 413p  Total Treatment Time (min): 60  Total Timed Codes (min): 60  1:1 Treatment Time (MC only): 56   Visit #:  3    Treatment Area: Impingement syndrome of right shoulder [M75.41]  Impingement syndrome of left shoulder [M75.42]    SUBJECTIVE  Pain Level (0-10 scale): 0/10  Any medication changes, allergies to medications, adverse drug reactions, diagnosis change, or new procedure performed?: [x]  No    []  Yes (see summary sheet for update)  Subjective functional status/changes:   []  No changes reported  Pt states that he did not aggravate his shoulder this weekend since he did not do any climbing.  He was able to pull a gallon of milk out of the refridgerator without pain.    OBJECTIVE    50 min Therapeutic Exercise:  [x]  See flow sheet :   Rationale: increase ROM, increase strength, improve coordination, improve balance, and increase proprioception to improve the patient's ability to perform overhead activity without pain.    10 min Manual Therapy: inferior G-H mobs grade 3-4, A-P G-H stabilization with passive shoulder flex and abd    Rationale: decrease pain, increase ROM, and increase tissue extensibility to improve the patient's ability to perform reaching tasks overhead            With   []  TE   []  TA   []  Neuro   []  SC   []  other: Patient Education: [x]  Review HEP    []  Progressed/Changed HEP based on:   []  positioning   []  body mechanics   []  transfers   []  heat/ice application    []  other:      Other Objective/Functional Measures: Left Shoulder PROM Flex 172 P!     Pain Level (0-10 scale) post treatment: 0/10    ASSESSMENT/Changes in Function:   Progressed with rotator cuff and scapular strengthening and positional awareness training today.  He ha sufficient group of exercises to  octninue on his own for a while allowing time to improve strength and endurance.   Patient will continue to benefit from skilled PT services to modify and progress therapeutic interventions, address functional mobility deficits, address ROM deficits, address strength deficits, analyze and address soft tissue restrictions, analyze and cue movement patterns, analyze and modify body mechanics/ergonomics, and assess and modify postural abnormalities to attain remaining goals.     []   See Plan of Care  []   See progress note/recertification  []   See Discharge Summary         Progress towards goals / Updated goals:  Short/Long Term Goals: To be accomplished in 3-6 treatments:  1) Pt will be able to Reach above shoulder level without increase of pain Progressing  2) Pt will be able to wash back without increase of pain Progressing  3) Pt will be independent with HEP Progressing       PLAN  [x]   Upgrade activities as tolerated     [x]   Continue plan of care  [x]   Update interventions per flow sheet       []   Discharge due to:_  []   Other:_      Ozell JONETTA Och, PT, DPT 12/15/2020

## 2020-12-15 NOTE — Progress Notes (Signed)
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  Medications: Verified on Patient Summary List     Start of Care: 12/02/2020                                                                              Onset Date: 09/2020               Visits from Start of Care: 3     Missed Visits: 0  Reporting Period : 12/02/2020 to 12/15/2020    Progress towards goals / Updated goals:  Short/Long Term Goals: To be accomplished in 3-6 treatments:  1) Pt will be able to Reach above shoulder level without increase of pain Progressing but not MET  2) Pt will be able to wash back without increase of pain Progressing but not MET  3) Pt will be independent with HEP Progressing but not MET      ASSESSMENT/SUMMARY OF CARE: At last session on 12/15/2020, he stated that he did not aggravate his shoulder that weekend since he did not do any climbing.  He was able to pull a gallon of milk out of the refridgerator without pain.  He did not scheduleb any additional appointments.  He has sufficient group of exercises to continue on his own for a while allowing time to improve strength and endurance.  Will reassess if he returns.    RECOMMENDATIONS:  [x]Discontinue therapy: []Patient has reached or is progressing toward set goals      [x]Patient is non-compliant or has abdicated      []Due to lack of appreciable progress towards set goals    Bobbie Stack, PT, DPT  01/08/2021
